# Patient Record
Sex: Female | Born: 2000 | Race: White | Hispanic: No | Marital: Single | Smoking: Never smoker
Health system: Southern US, Community
[De-identification: ages and names within clinical notes are randomized; demographics above are authoritative.]

## PROBLEM LIST (undated history)

## (undated) DIAGNOSIS — N390 Urinary tract infection, site not specified: Secondary | ICD-10-CM

## (undated) HISTORY — DX: Urinary tract infection, site not specified: N39.0

---
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
## 2019-01-06 ENCOUNTER — Telehealth: Payer: Self-pay | Admitting: Pediatrics

## 2019-01-06 MED ORDER — CEPHALEXIN 500 MG PO CAPS: 500.0000 mg | ORAL_CAPSULE | Freq: Three times a day (TID) | ORAL | 0 refills | Status: AC

## 2019-01-06 NOTE — Telephone Encounter (Signed)
Casey Carpenter with symptoms of UTI.  She hsa had history of UTI's and having increase urgency, dysuria, smelly urine per mom.  She has always had UTI with Ecoli sensitive to keflex.  Will send in antibiotic.

## 2019-08-09 ENCOUNTER — Telehealth: Payer: Self-pay | Admitting: Pediatrics

## 2019-08-09 ENCOUNTER — Other Ambulatory Visit: Payer: Self-pay | Admitting: Pediatrics

## 2019-08-09 MED ORDER — CEPHALEXIN 500 MG PO CAPS: 500.0000 mg | ORAL_CAPSULE | Freq: Three times a day (TID) | ORAL | 0 refills | Status: AC

## 2019-08-09 NOTE — Telephone Encounter (Signed)
Called in keflex for UTI

## 2019-08-09 NOTE — Telephone Encounter (Signed)
meds sent

## 2020-06-05 ENCOUNTER — Telehealth: Payer: Self-pay | Admitting: Pediatrics

## 2020-06-05 NOTE — Telephone Encounter (Signed)
[employer] immunization form complete

## 2020-06-05 NOTE — Telephone Encounter (Signed)
[employer] form on your desk to fill out please °
